# Patient Record
Sex: Female | Born: 1985 | Race: Black or African American | Hispanic: No | Marital: Single | Smoking: Never smoker
Health system: Southern US, Community
[De-identification: ages and names within clinical notes are randomized; demographics above are authoritative.]

## PROBLEM LIST (undated history)

## (undated) DIAGNOSIS — G43909 Migraine, unspecified, not intractable, without status migrainosus: Secondary | ICD-10-CM

---

## 2016-01-02 ENCOUNTER — Emergency Department (HOSPITAL_COMMUNITY): Payer: Commercial Managed Care - PPO

## 2016-01-02 ENCOUNTER — Emergency Department (HOSPITAL_COMMUNITY)
Admission: EM | Admit: 2016-01-02 | Discharge: 2016-01-02 | Disposition: A | Discharge status: Home / Self Care | Payer: Commercial Managed Care - PPO | Attending: Emergency Medicine | Admitting: Emergency Medicine

## 2016-01-02 ENCOUNTER — Encounter (HOSPITAL_COMMUNITY): Payer: Self-pay

## 2016-01-02 DIAGNOSIS — Z79899 Other long term (current) drug therapy: Secondary | ICD-10-CM | POA: Diagnosis not present

## 2016-01-02 DIAGNOSIS — N390 Urinary tract infection, site not specified: Secondary | ICD-10-CM | POA: Diagnosis not present

## 2016-01-02 DIAGNOSIS — R4182 Altered mental status, unspecified: Secondary | ICD-10-CM | POA: Diagnosis present

## 2016-01-02 HISTORY — DX: Migraine, unspecified, not intractable, without status migrainosus: G43.909

## 2016-01-02 LAB — URINALYSIS, ROUTINE W REFLEX MICROSCOPIC
Bilirubin Urine: NEGATIVE
Glucose, UA: NEGATIVE mg/dL
Ketones, ur: NEGATIVE mg/dL
Nitrite: POSITIVE — AB
Protein, ur: NEGATIVE mg/dL
Specific Gravity, Urine: 1.009 (ref 1.005–1.030)
pH: 7 (ref 5.0–8.0)

## 2016-01-02 LAB — CBC WITH DIFFERENTIAL/PLATELET
Basophils Absolute: 0 K/uL (ref 0.0–0.1)
Basophils Relative: 0 %
Eosinophils Absolute: 0 K/uL (ref 0.0–0.7)
Eosinophils Relative: 0 %
HCT: 37.1 % (ref 36.0–46.0)
Hemoglobin: 12.4 g/dL (ref 12.0–15.0)
Lymphocytes Relative: 23 %
Lymphs Abs: 2.1 K/uL (ref 0.7–4.0)
MCH: 27.1 pg (ref 26.0–34.0)
MCHC: 33.4 g/dL (ref 30.0–36.0)
MCV: 81.2 fL (ref 78.0–100.0)
Monocytes Absolute: 0.4 K/uL (ref 0.1–1.0)
Monocytes Relative: 4 %
Neutro Abs: 6.6 K/uL (ref 1.7–7.7)
Neutrophils Relative %: 73 %
Platelets: 227 K/uL (ref 150–400)
RBC: 4.57 MIL/uL (ref 3.87–5.11)
RDW: 13.4 % (ref 11.5–15.5)
WBC: 9.1 K/uL (ref 4.0–10.5)

## 2016-01-02 LAB — ACETAMINOPHEN LEVEL: Acetaminophen (Tylenol), Serum: <10 ug/mL — ABNORMAL LOW (ref 10–30)

## 2016-01-02 LAB — URINE MICROSCOPIC-ADD ON

## 2016-01-02 LAB — COMPREHENSIVE METABOLIC PANEL
ALT: 19 U/L (ref 14–54)
AST: 18 U/L (ref 15–41)
Albumin: 3.9 g/dL (ref 3.5–5.0)
Alkaline Phosphatase: 44 U/L (ref 38–126)
Anion gap: 5 (ref 5–15)
BUN: 8 mg/dL (ref 6–20)
CO2: 22 mmol/L (ref 22–32)
Calcium: 8.4 mg/dL — ABNORMAL LOW (ref 8.9–10.3)
Chloride: 111 mmol/L (ref 101–111)
Creatinine, Ser: 0.59 mg/dL (ref 0.44–1.00)
GFR calc Af Amer: >60 mL/min (ref >60)
GFR calc non Af Amer: >60 mL/min (ref >60)
Glucose, Bld: 95 mg/dL (ref 65–99)
Potassium: 3.7 mmol/L (ref 3.5–5.1)
Sodium: 138 mmol/L (ref 135–145)
Total Bilirubin: 1.9 mg/dL — ABNORMAL HIGH (ref 0.3–1.2)
Total Protein: 6.6 g/dL (ref 6.5–8.1)

## 2016-01-02 LAB — POC URINE PREG, ED: Preg Test, Ur: NEGATIVE

## 2016-01-02 LAB — PROTIME-INR
INR: 1.25 (ref 0.00–1.49)
Prothrombin Time: 15.4 seconds — ABNORMAL HIGH (ref 11.6–15.2)

## 2016-01-02 LAB — LIPASE, BLOOD: Lipase: 23 U/L (ref 11–51)

## 2016-01-02 LAB — AMMONIA: Ammonia: 13 umol/L (ref 9–35)

## 2016-01-02 MED ORDER — DEXTROSE 5 % IV SOLN
1.0000 g | Freq: Once | INTRAVENOUS | Status: AC
  Administered 2016-01-02: 1 g via INTRAVENOUS
  Filled 2016-01-02: qty 10

## 2016-01-02 MED ORDER — DIPHENHYDRAMINE HCL 50 MG/ML IJ SOLN
25.0000 mg | Freq: Once | INTRAMUSCULAR | Status: AC
  Administered 2016-01-02: 25 mg via INTRAVENOUS
  Filled 2016-01-02: qty 1

## 2016-01-02 MED ORDER — CIPROFLOXACIN HCL 500 MG PO TABS: 500.0000 mg | ORAL_TABLET | Freq: Two times a day (BID) | ORAL | Status: AC

## 2016-01-02 MED ORDER — METOCLOPRAMIDE HCL 10 MG PO TABS
10.0000 mg | ORAL_TABLET | Freq: Once | ORAL | Status: AC
  Administered 2016-01-02: 10 mg via ORAL
  Filled 2016-01-02: qty 1

## 2016-01-02 MED ORDER — IOPAMIDOL (ISOVUE-370) INJECTION 76%
100.0000 mL | Freq: Once | INTRAVENOUS | Status: AC | PRN
  Administered 2016-01-02: 75 mL via INTRAVENOUS

## 2016-01-02 MED ORDER — SODIUM CHLORIDE 0.9 % IV BOLUS (SEPSIS)
1000.0000 mL | Freq: Once | INTRAVENOUS | Status: AC
  Administered 2016-01-02: 1000 mL via INTRAVENOUS

## 2016-01-02 MED ORDER — KETOROLAC TROMETHAMINE 15 MG/ML IJ SOLN
15.0000 mg | Freq: Once | INTRAMUSCULAR | Status: AC
  Administered 2016-01-02: 15 mg via INTRAVENOUS
  Filled 2016-01-02: qty 1

## 2016-01-02 NOTE — ED Notes (Signed)
She states she has hx of migraine h/a; and is on Depakote for same.  She is able to make a cell phone call to her mother after arrival.

## 2016-01-02 NOTE — Discharge Instructions (Signed)
Please take antibiotics as directed. Please follow-up with your neurologist for further evaluation and management of ongoing symptoms. Please return to the emergency room immediately if any new or worsening signs or symptoms present.

## 2016-01-02 NOTE — ED Notes (Signed)
Upon her arriving at work this morning, she was observed by her employer to be acting strangely and was very sleepy in appearance.  She told EMS that she hasn't felt well recently and that her "urine has been dark".  She arrives here in no distress.

## 2016-01-02 NOTE — ED Provider Notes (Signed)
CSN: [id number]     Arrival date & time 01/02/16  [id number] History   First MD Initiated Contact with Patient 01/02/16 [phone]     Chief Complaint  Patient presents with  . Altered Mental Status    HPI   30 year old female presents today with altered mental status. Patient reports that last night she started to develop a headache on the left side. She reports this was typical of her previous, but had the addition of numbness and weakness of her left side upper extremities and face. She reports that she went into work this morning, was confused, cannot quite recall how she got into work or who had called the ambulance for her to be seen here. She denies any fevers at home, nausea, vomiting, chest pain or shortness of breath. She reports posterior neck pain, soreness and tightness with head movements.  Patient reports that she is using Depakote for her headaches, also uses sumatriptan.  Patient denies any significant Tylenol, drugs, alcohol, liver dysfunction.   Past Medical History  Diagnosis Date  . Migraine    No past surgical history on file. No family history on file. Social History  Substance Use Topics  . Smoking status: Never Smoker   . Smokeless tobacco: None  . Alcohol Use: No   OB History    No data available     Review of Systems  All other systems reviewed and are negative.  Allergies  Sulfa antibiotics and Latex  Home Medications   Prior to Admission medications   Medication Sig Start Date End Date Taking? Authorizing Provider  BIOTIN PO Take 1 tablet by mouth daily.   Yes Historical Provider, MD  Cyanocobalamin (B-12 PO) Take 1 tablet by mouth daily.   Yes Historical Provider, MD  divalproex (DEPAKOTE ER) 250 MG 24 hr tablet take 750mg  daily at bedtime 12/04/15  Yes Historical Provider, MD  folic acid (FOLVITE) 400 MCG tablet Take 400 mcg by mouth daily.   Yes Historical Provider, MD  ibuprofen (ADVIL,MOTRIN) 200 MG tablet Take 800 mg by mouth 2 (two) times daily as  needed for headache or moderate pain.   Yes Historical Provider, MD  Prenatal Vit-Fe Fumarate-FA (PRENATAL PO) Take 1 tablet by mouth daily.   Yes Historical Provider, MD  SUMAtriptan (IMITREX) 100 MG tablet Take 100 mg by mouth 2 (two) times daily as needed for migraine or headache.  10/01/15  Yes Historical Provider, MD  ciprofloxacin (CIPRO) 500 MG tablet Take 1 tablet (500 mg total) by mouth every 12 (twelve) hours. 01/02/16   Eyvonne MechanicJeffrey Karmella Bouvier, PA-C   BP 96/56 mmHg  Pulse 78  Temp(Src) 98.5 F (36.9 C) (Oral)  Resp 18  SpO2 100%  LMP 11/20/2015 Physical Exam  Constitutional: She is oriented to person, place, and time. She appears well-developed and well-nourished.  HENT:  Head: Normocephalic and atraumatic.  Eyes: Conjunctivae are normal. Pupils are equal, round, and reactive to light. Right eye exhibits no discharge. Left eye exhibits no discharge. No scleral icterus.  Neck: Normal range of motion. No JVD present. No tracheal deviation present.  Cardiovascular: Normal rate, regular rhythm, normal heart sounds and intact distal pulses.  Exam reveals no gallop and no friction rub.   No murmur heard. Pulmonary/Chest: Effort normal and breath sounds normal. No stridor. No respiratory distress. She has no wheezes. She has no rales. She exhibits no tenderness.  Musculoskeletal: Normal range of motion. She exhibits no edema or tenderness.  Neurological: She is alert and oriented to person,  place, and time. Coordination normal.  Pupils are equal round reactive to light, patient expresses decreased sensation to the left face, no asymmetry noted, no slurred speech. Patient reports decreased sensation to the left upper and lower extremities, strength 4 out of 5 compared to 5 out of 5 on the right. Patellar reflexes 2+. Finger to nose normal  Skin: Skin is warm and dry. No rash noted. No erythema. No pallor.  Psychiatric: She has a normal mood and affect. Her behavior is normal. Judgment and thought  content normal.  Nursing note and vitals reviewed.   ED Course  Procedures (including critical care time) Labs Review Labs Reviewed  ACETAMINOPHEN LEVEL - Abnormal; Notable for the following:    Acetaminophen (Tylenol), Serum <10 (*)    All other components within normal limits  COMPREHENSIVE METABOLIC PANEL - Abnormal; Notable for the following:    Calcium 8.4 (*)    Total Bilirubin 1.9 (*)    All other components within normal limits  URINALYSIS, ROUTINE W REFLEX MICROSCOPIC (NOT AT [facility]) - Abnormal; Notable for the following:    APPearance CLOUDY (*)    Hgb urine dipstick TRACE (*)    Nitrite POSITIVE (*)    Leukocytes, UA SMALL (*)    All other components within normal limits  PROTIME-INR - Abnormal; Notable for the following:    Prothrombin Time 15.4 (*)    All other components within normal limits  URINE MICROSCOPIC-ADD ON - Abnormal; Notable for the following:    Squamous Epithelial / LPF 0-5 (*)    Bacteria, UA MANY (*)    All other components within normal limits  AMMONIA  LIPASE, BLOOD  CBC WITH DIFFERENTIAL/PLATELET  POC URINE PREG, ED    Imaging Review Ct Angio Head W/cm &/or Wo Cm  01/02/2016  CLINICAL DATA:  30 year old female with left side weakness, headache, confusion and altered mental status. Initial encounter. EXAM: CT ANGIOGRAPHY HEAD AND NECK TECHNIQUE: Multidetector CT imaging of the head and neck was performed using the standard protocol during bolus administration of intravenous contrast. Multiplanar CT image reconstructions and MIPs were obtained to evaluate the vascular anatomy. Carotid stenosis measurements (when applicable) are obtained utilizing NASCET criteria, using the distal internal carotid diameter as the denominator. CONTRAST:  75 mL Isovue 370 COMPARISON:  None. FINDINGS: CT HEAD Brain: Cerebral volume is normal. No midline shift, ventriculomegaly, mass effect, evidence of mass lesion, intracranial hemorrhage or evidence of cortically based  acute infarction. Gray-white matter differentiation is within normal limits throughout the brain. Calvarium and skull base: Negative. Paranasal sinuses: Clear. Orbits: Negative orbit and scalp soft tissues. CTA NECK Skeleton: Intermittent poor dentition. No acute osseous abnormality identified. Other neck: Negative lung apices. Negative visualized superior mediastinum. Thyroid, larynx, pharynx, parapharyngeal spaces, retropharyngeal space, sublingual space, submandibular glands, and parotid glands are within normal limits. Cervical lymph nodes are normal for age. Aortic arch: 3 vessel arch configuration with no arch atherosclerosis or great vessel origin stenosis. Right carotid system: Negative. Left carotid system: Negative. Vertebral arteries:No proximal subclavian artery stenosis. Normal vertebral artery origins. Fairly codominant vertebral arteries, normal in the neck aside from occasional tortuosity. CTA HEAD Posterior circulation: Normal distal vertebral arteries, the left is mildly dominant. Proximal but otherwise normal right PICA origin. Normal left PICA origin. Mildly tortuous but otherwise normal vertebrobasilar junction. Normal basilar artery. Normal SCA and PCA origins. Both posterior communicating arteries are present, the right is larger. Bilateral PCA branches are within normal limits. Anterior circulation: Both ICA siphons are patent  and appear normal. Normal ophthalmic and posterior communicating artery origins. Patent carotid termini. Normal MCA and ACA origins. Anterior communicating artery and bilateral ACA branches are within normal limits. MCA M1 segments, MCA bifurcations, and bilateral MCA branches are normal. Venous sinuses: Patent. Anatomic variants: Mildly dominant left vertebral artery. Delayed phase: No abnormal enhancement identified. IMPRESSION: 1. Negative CTA Head and Neck. 2.  Normal CT appearance of the brain. 3. No acute findings in the neck. Electronically Signed   By: Odessa Fleming  M.D.   On: 01/02/2016 11:57   Ct Angio Neck W/cm &/or Wo/cm  01/02/2016  CLINICAL DATA:  30 year old female with left side weakness, headache, confusion and altered mental status. Initial encounter. EXAM: CT ANGIOGRAPHY HEAD AND NECK TECHNIQUE: Multidetector CT imaging of the head and neck was performed using the standard protocol during bolus administration of intravenous contrast. Multiplanar CT image reconstructions and MIPs were obtained to evaluate the vascular anatomy. Carotid stenosis measurements (when applicable) are obtained utilizing NASCET criteria, using the distal internal carotid diameter as the denominator. CONTRAST:  75 mL Isovue 370 COMPARISON:  None. FINDINGS: CT HEAD Brain: Cerebral volume is normal. No midline shift, ventriculomegaly, mass effect, evidence of mass lesion, intracranial hemorrhage or evidence of cortically based acute infarction. Gray-white matter differentiation is within normal limits throughout the brain. Calvarium and skull base: Negative. Paranasal sinuses: Clear. Orbits: Negative orbit and scalp soft tissues. CTA NECK Skeleton: Intermittent poor dentition. No acute osseous abnormality identified. Other neck: Negative lung apices. Negative visualized superior mediastinum. Thyroid, larynx, pharynx, parapharyngeal spaces, retropharyngeal space, sublingual space, submandibular glands, and parotid glands are within normal limits. Cervical lymph nodes are normal for age. Aortic arch: 3 vessel arch configuration with no arch atherosclerosis or great vessel origin stenosis. Right carotid system: Negative. Left carotid system: Negative. Vertebral arteries:No proximal subclavian artery stenosis. Normal vertebral artery origins. Fairly codominant vertebral arteries, normal in the neck aside from occasional tortuosity. CTA HEAD Posterior circulation: Normal distal vertebral arteries, the left is mildly dominant. Proximal but otherwise normal right PICA origin. Normal left PICA  origin. Mildly tortuous but otherwise normal vertebrobasilar junction. Normal basilar artery. Normal SCA and PCA origins. Both posterior communicating arteries are present, the right is larger. Bilateral PCA branches are within normal limits. Anterior circulation: Both ICA siphons are patent and appear normal. Normal ophthalmic and posterior communicating artery origins. Patent carotid termini. Normal MCA and ACA origins. Anterior communicating artery and bilateral ACA branches are within normal limits. MCA M1 segments, MCA bifurcations, and bilateral MCA branches are normal. Venous sinuses: Patent. Anatomic variants: Mildly dominant left vertebral artery. Delayed phase: No abnormal enhancement identified. IMPRESSION: 1. Negative CTA Head and Neck. 2.  Normal CT appearance of the brain. 3. No acute findings in the neck. Electronically Signed   By: Odessa Fleming M.D.   On: 01/02/2016 11:57   I have personally reviewed and evaluated these images and lab results as part of my medical decision-making.   EKG Interpretation None      MDM   Final diagnoses:  UTI (lower urinary tract infection)    Labs:PT INR, ammonia, acetaminophen, CMP, lipase, CBC, urinalysis  Imaging: CT angiogram, CT imaging head, CT head without  Consults:  Therapeutics:Ceftriaxone, Toradol, Reglan, Benadryl, was saline  Discharge Meds:   Assessment/Plan: 30 year old female presents today with reports of confusion and neurological deficits. She reports headache symptoms started yesterday, with confusion at work today. Patient is a poor historian, and does not give a clear story, it does not  appear to be acutely altered here. Patient reports headache symptoms are typical of previous but left-sided facial numbness new. As the exam went on patient started complaining of left-sided weakness in addition to the other complaints. Neurological exam showed 4-5 strength in the left upper and lower extremities. Patient's effort was poor during  my exam, she was able to use her left hand to use her cell phone when needed, was acting slow with head movements when talking to me but was able to quickly turn her head to look at her cell phone for incoming notifications. Patient's labs returned showing no signs of systemic infection, she did have urinalysis significant for urinary tract infection. Patient CTA neck head and CT without showed no acute findings. After presenting findings patient her symptoms dramatically improved, she was able to ambulate here in the ED without difficulty, second reassessment showed no neurological deficits 5 out of 5 strength bilaterally, sensation intact throughout. Patient was alert and oriented at her baseline. Family was present reporting that she was at her baseline. Patient has no infectious etiologies concerning for meningitis at this time. Has no flank pain, or clinical signs of pyelonephritis, but with the questionable reports of confusion she will be treated for that with Rocephin here in the ED and Cipro at home. Patient was given strict return precautions, she verbalized understanding and agreement to today's plan had no further questions or concerns at time of discharge.        Eyvonne Mechanic, PA-C 01/02/16 1444  Rolland Porter, MD 01/10/16 [phone]

## 2016-01-02 NOTE — ED Notes (Signed)
Pt. Is able to ambulate in our hallway capably and without assistance.  Her mother and another adult female are visiting.  Our P.A. Is speaking with them as I write this.

## 2016-01-02 NOTE — ED Notes (Signed)
Bed: [id number] Expected date: 01/02/16 Expected time: 8:39 AM Means of arrival: Ambulance Comments: EMS- sepsis

## 2016-12-02 IMAGING — CT CT ANGIO HEAD
2 of 10 series · 8 of 33 positions shown · IV contrast (isovue)
Comparison: None.

CLINICAL DATA: 30-year-old female with left side weakness,
headache, confusion and altered mental status. Initial encounter.

EXAM:
CT ANGIOGRAPHY HEAD AND NECK
TECHNIQUE: Multidetector CT imaging of the head and neck was performed using
the standard protocol during bolus administration of intravenous
contrast. Multiplanar CT image reconstructions and MIPs were
obtained to evaluate the vascular anatomy. Carotid stenosis
measurements (when applicable) are obtained utilizing NASCET
criteria, using the distal internal carotid diameter as the
denominator.
CONTRAST:  75 mL Isovue 370

[Series 8: cta headneck · axial · 0.45mm/px · z∈[-254,-62]mm · 4 of 162 slices shown]
[im 33/162  soft-tissue]
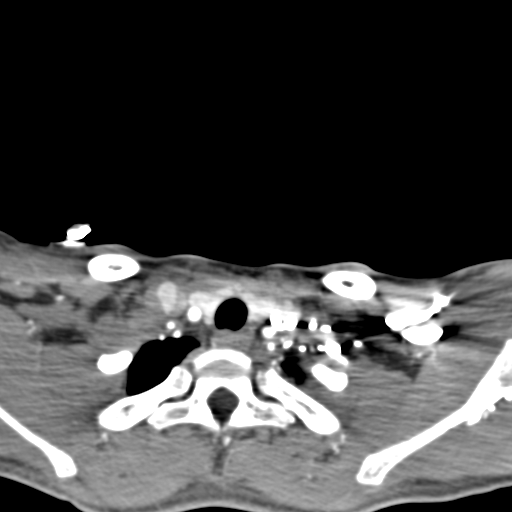
[im 65/162  bone]
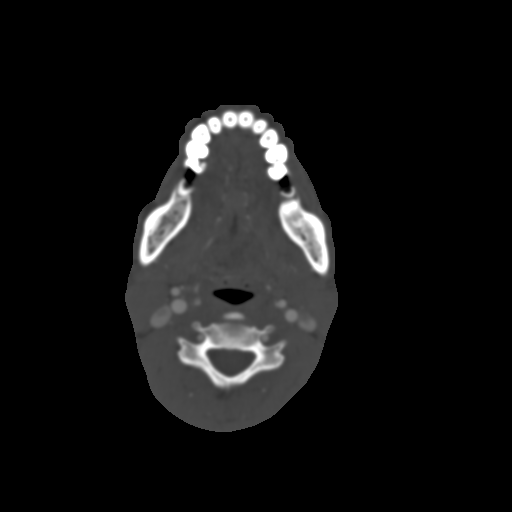
[im 97/162  soft-tissue]
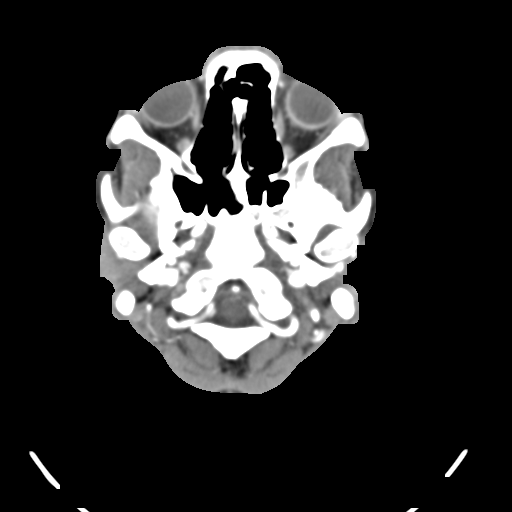
[im 129/162  bone]
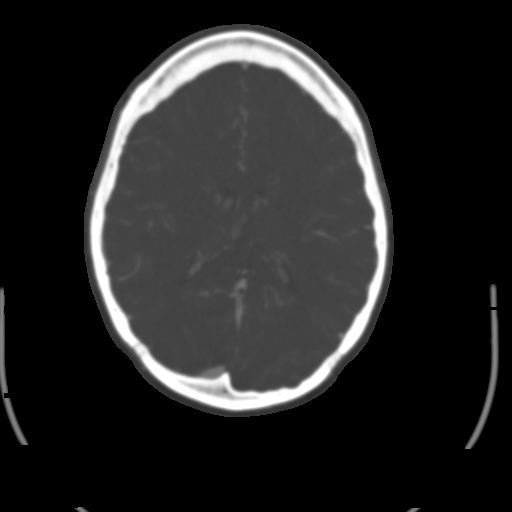

[Series 606: axial · axial · 0.50mm/px · z∈[-267,-82]mm · 4 of 156 slices shown]
[im 32/156  soft-tissue]
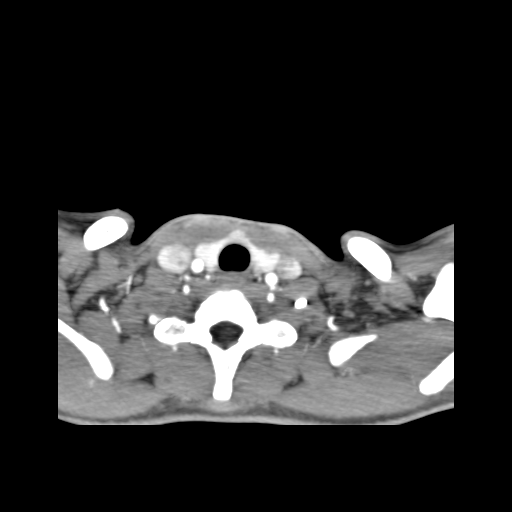
[im 63/156  soft-tissue]
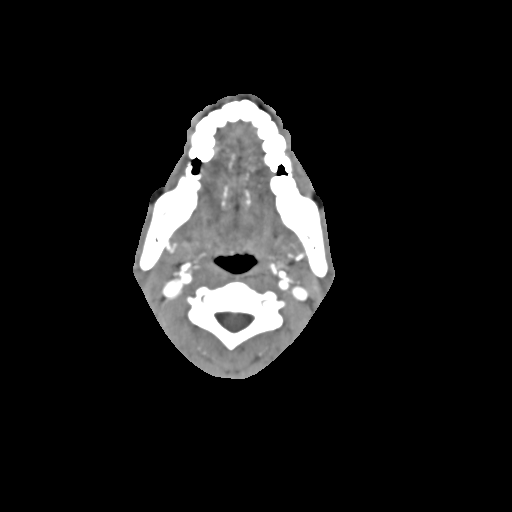
[im 94/156  soft-tissue]
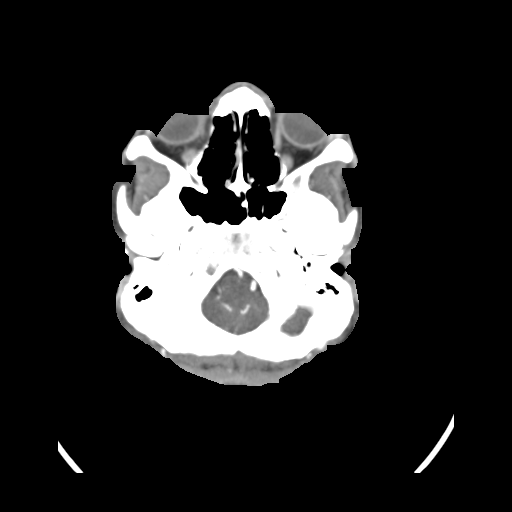
[im 125/156  soft-tissue]
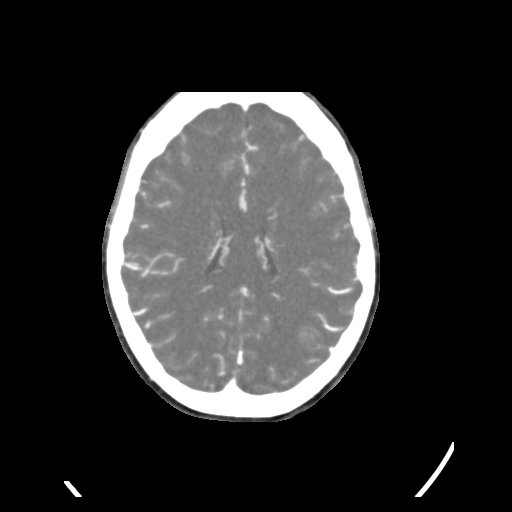

[8 of 33 positions shown; findings below may reference images not displayed]

FINDINGS: CT HEAD

Brain: Cerebral volume is normal. No midline shift,
ventriculomegaly, mass effect, evidence of mass lesion, intracranial
hemorrhage or evidence of cortically based acute infarction.
Gray-white matter differentiation is within normal limits throughout
the brain.

Calvarium and skull base: Negative.

Paranasal sinuses: Clear.

Orbits: Negative orbit and scalp soft tissues.

CTA NECK

Skeleton: Intermittent poor dentition. No acute osseous abnormality
identified.

Other neck: Negative lung apices. Negative visualized superior
mediastinum.

Thyroid, larynx, pharynx, parapharyngeal spaces, retropharyngeal
space, sublingual space, submandibular glands, and parotid glands
are within normal limits. Cervical lymph nodes are normal for age.

Aortic arch: 3 vessel arch configuration with no arch
atherosclerosis or great vessel origin stenosis.

Right carotid system: Negative.

Left carotid system: Negative.

Vertebral arteries:No proximal subclavian artery stenosis. Normal
vertebral artery origins. Fairly codominant vertebral arteries,
normal in the neck aside from occasional tortuosity.

CTA HEAD

Posterior circulation: Normal distal vertebral arteries, the left is
mildly dominant. Proximal but otherwise normal right PICA origin.
Normal left PICA origin. Mildly tortuous but otherwise normal
vertebrobasilar junction. Normal basilar artery. Normal SCA and PCA
origins. Both posterior communicating arteries are present, the
right is larger. Bilateral PCA branches are within normal limits.

Anterior circulation: Both ICA siphons are patent and appear normal.
Normal ophthalmic and posterior communicating artery origins. Patent
carotid termini. Normal MCA and ACA origins. Anterior communicating
artery and bilateral ACA branches are within normal limits. MCA M1
segments, MCA bifurcations, and bilateral MCA branches are normal.

Venous sinuses: Patent.

Anatomic variants: Mildly dominant left vertebral artery.

Delayed phase: No abnormal enhancement identified.
IMPRESSION: 1. Negative CTA Head and Neck.
2.  Normal CT appearance of the brain.
3. No acute findings in the neck.
